# Patient Record
Sex: Male | Born: 1985 | Race: White | Hispanic: No | Marital: Single
Health system: Midwestern US, Community
[De-identification: ages and names within clinical notes are randomized; demographics above are authoritative.]

## PROBLEM LIST (undated history)

## (undated) HISTORY — PX: FACIAL FRACTURE SURGERY: SHX1570

---
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
## 2007-09-30 ENCOUNTER — Emergency Department: Payer: Self-pay | Admitting: Emergency Medicine
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
## 2011-03-25 ENCOUNTER — Emergency Department: Payer: Self-pay | Admitting: *Deleted

## 2014-04-15 ENCOUNTER — Encounter (HOSPITAL_COMMUNITY): Payer: Self-pay

## 2014-04-15 ENCOUNTER — Emergency Department (HOSPITAL_COMMUNITY)
Admission: EM | Admit: 2014-04-15 | Discharge: 2014-04-15 | Disposition: A | Discharge status: Home / Self Care | Payer: Self-pay | Attending: Emergency Medicine | Admitting: Emergency Medicine

## 2014-04-15 ENCOUNTER — Emergency Department (HOSPITAL_COMMUNITY): Payer: Self-pay

## 2014-04-15 DIAGNOSIS — W500XXA Accidental hit or strike by another person, initial encounter: Secondary | ICD-10-CM | POA: Insufficient documentation

## 2014-04-15 DIAGNOSIS — Z880 Allergy status to penicillin: Secondary | ICD-10-CM | POA: Insufficient documentation

## 2014-04-15 DIAGNOSIS — S0083XA Contusion of other part of head, initial encounter: Secondary | ICD-10-CM | POA: Insufficient documentation

## 2014-04-15 DIAGNOSIS — Y990 Civilian activity done for income or pay: Secondary | ICD-10-CM | POA: Insufficient documentation

## 2014-04-15 DIAGNOSIS — S0993XA Unspecified injury of face, initial encounter: Secondary | ICD-10-CM

## 2014-04-15 DIAGNOSIS — Y9389 Activity, other specified: Secondary | ICD-10-CM | POA: Insufficient documentation

## 2014-04-15 DIAGNOSIS — Z8781 Personal history of (healed) traumatic fracture: Secondary | ICD-10-CM | POA: Insufficient documentation

## 2014-04-15 DIAGNOSIS — Y9289 Other specified places as the place of occurrence of the external cause: Secondary | ICD-10-CM | POA: Insufficient documentation

## 2014-04-15 MED ORDER — ONDANSETRON HCL 4 MG PO TABS: 4.0000 mg | ORAL_TABLET | Freq: Four times a day (QID) | ORAL | Status: AC

## 2014-04-15 MED ORDER — CEPHALEXIN 500 MG PO CAPS: 500.0000 mg | ORAL_CAPSULE | Freq: Four times a day (QID) | ORAL | Status: AC

## 2014-04-15 MED ORDER — OXYCODONE-ACETAMINOPHEN 5-325 MG PO TABS: 1.0000 | ORAL_TABLET | Freq: Four times a day (QID) | ORAL | Status: AC | PRN

## 2014-04-15 MED ORDER — ONDANSETRON 4 MG PO TBDP
4.0000 mg | ORAL_TABLET | Freq: Once | ORAL | Status: AC
  Administered 2014-04-15: 4 mg via ORAL
  Filled 2014-04-15: qty 1

## 2014-04-15 MED ORDER — OXYCODONE-ACETAMINOPHEN 5-325 MG PO TABS
2.0000 | ORAL_TABLET | Freq: Once | ORAL | Status: AC
  Administered 2014-04-15: 2 via ORAL
  Filled 2014-04-15: qty 2

## 2014-04-15 MED ORDER — IBUPROFEN 800 MG PO TABS: 800.0000 mg | ORAL_TABLET | Freq: Three times a day (TID) | ORAL | Status: AC

## 2014-04-15 NOTE — Discharge Instructions (Signed)
Mandibular Contusion °A mandibular contusion is a deep bruise of your jaw. Contusions are the result of an injury that caused bleeding under the skin. The contusion may turn blue, purple, or yellow. Minor injuries will give you a painless contusion, but more severe contusions may stay painful and swollen for a few weeks.  °CAUSES °A mandibular contusion comes from a direct force to that area, such as falling or a punch to the jaw. °SYMPTOMS  °· Jaw pain. °· Jaw swelling. °· Jaw bruising. °· Jaw tenderness. °DIAGNOSIS  °The diagnosis can be made by taking your history and doing a physical exam. You may need an X-ray of your jaw to look for a broken bone (fracture). °TREATMENT °Often, the best treatment for a mandibular contusion is applying cold compresses to the injured area and eating a soft diet. Over-the-counter medicines may also be recommended for pain control.  °HOME CARE INSTRUCTIONS  °· Put ice on the injured area. °¨ Put ice in a plastic bag. °¨ Place a towel between your skin and the bag. °¨ Leave the ice on for 15-20 minutes, 03-04 times a day. °· Eat soft foods for 1 week. Soft foods include baby food, gelatin, cooked cereal, ice cream, applesauce, bananas, eggs, pasta, cottage cheese, soups, and yogurt. Cut food into smaller pieces for less chewing. Avoid chewing gum or ice. °· Only take over-the-counter or prescription medicines for pain, discomfort, or fever as directed by your caregiver. °· Avoid opening your mouth widely. This includes opening your mouth to eat large pieces of food or to yawn, scream, yell, or sing. °SEEK IMMEDIATE MEDICAL CARE IF:  °· Your swelling or pain is not relieved with medicines. °· You are not improving. °· You have any cracking or clicking (crepitation) in the jaw joint. °MAKE SURE YOU:  °· Understand these instructions. °· Will watch your condition. °· Will get help right away if you are not doing well or get worse. °Document Released: 07/24/2003 Document Revised:  07/26/2011 Document Reviewed: 03/19/2011 °Exit[employer]® Patient Information ©2015 [employer]. This information is not intended to replace advice given to you by your health care provider. Make sure you discuss any questions you have with your health care provider. ° °
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
## 2014-06-18 ENCOUNTER — Inpatient Hospital Stay
Admit: 2014-06-18 | Discharge: 2014-06-18 | Disposition: A | Discharge status: Home / Self Care | Attending: Emergency Medicine
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
## 2017-07-07 ENCOUNTER — Emergency Department
Admission: EM | Admit: 2017-07-07 | Discharge: 2017-07-07 | Discharge status: Against Medical Advice | Payer: Self-pay | Attending: Emergency Medicine | Admitting: Emergency Medicine

## 2017-07-07 ENCOUNTER — Encounter: Payer: Self-pay | Admitting: Emergency Medicine

## 2017-07-07 DIAGNOSIS — T401X1A Poisoning by heroin, accidental (unintentional), initial encounter: Secondary | ICD-10-CM | POA: Insufficient documentation

## 2017-07-07 MED ORDER — NALOXONE HCL 4 MG/0.1ML NA LIQD
1.0000 | Freq: Once | NASAL | Status: AC
  Administered 2017-07-07: 1 via NASAL
  Filled 2017-07-07 (×2): qty 4

## 2017-07-07 NOTE — ED Notes (Signed)
Attempts by this RN and charge RN to explain to patient procedure and reasons patient needs to stay. Pt states "I don't care I just want to leave."

## 2017-07-07 NOTE — ED Notes (Signed)
Pt provided with scrub top, scrub pants, and non slip socks. Pt refuses to use them or take them. Pt independently walking in room with steady gait.

## 2017-07-07 NOTE — ED Triage Notes (Addendum)
Pt arrived via ems after friend called ems due to patient being unresponsive. Pt given 1mg  intranasal narcan by fire dept. EMS arrived and administered 0.5mg  of IV narcan. Upon arrival to ED pt alert and oriented and adamant that he leave and take out IV. Pt denies any suicidal ideation. Pt states "I obviously took some bad stuff." Pt admits to injecting medication.

## 2017-07-07 NOTE — ED Provider Notes (Signed)
[facility] Emergency Department Provider Note   ____________________________________________    I have reviewed the triage vital signs and the nursing notes.   HISTORY  Chief Complaint Drug Overdose     HPI Miguel Richards. is a 32 y.o. male who presents after accidental drug overdose.  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  Apparently he became unresponsive call, EMS was called gave intranasal Narcan with little improvement, then IV Narcan with patient then returning to baseline.  Patient is quite clear that he was not trying to harm himself.  Immediately upon arrival the patient was asking to leave   History reviewed. No pertinent past medical history.  There are no active problems to display for this patient.   Past Surgical History:  Procedure Laterality Date  . FACIAL FRACTURE SURGERY      Prior to Admission medications   Medication Sig Start Date End Date Taking? Authorizing Provider  cephALEXin (KEFLEX) 500 MG capsule Take 1 capsule (500 mg total) by mouth 4 (four) times daily. 04/15/14   Marlon Pel, PA-C  ibuprofen (ADVIL,MOTRIN) 800 MG tablet Take 1 tablet (800 mg total) by mouth 3 (three) times daily. 04/15/14   Marlon Pel, PA-C  ondansetron (ZOFRAN) 4 MG tablet Take 1 tablet (4 mg total) by mouth every 6 (six) hours. 04/15/14   Marlon Pel, PA-C  oxyCODONE-acetaminophen (PERCOCET/ROXICET) 5-325 MG per tablet Take 1-2 tablets by mouth every 6 (six) hours as needed for severe pain. 04/15/14   Marlon Pel, PA-C     Allergies Penicillins  No family history on file.  Social History Social History   Tobacco Use  . Smoking status: Never Smoker  . Smokeless tobacco: Never Used  Substance Use Topics  . Alcohol use: Yes    Comment: social  . Drug use: No    Review of Systems  Constitutional: Denies fevers Eyes: No visual changes.  ENT: Denies neck  pain Cardiovascular: Denies chest pain. Respiratory: No cough Gastrointestinal:No nausea, no vomiting.   Genitourinary: Negative for dysuria. Musculoskeletal: Negative for back pain. Skin: Negative for rash. Neurological: Negative for headaches   ____________________________________________   PHYSICAL EXAM:  VITAL SIGNS: ED Triage Vitals [07/07/17 0833]  Enc Vitals Group     BP 126/85     Pulse Rate 97     Resp 14     Temp 97.6 F (36.4 C)     Temp Source Oral     SpO2 100 %     Weight      Height      Head Circumference      Peak Flow      Pain Score      Pain Loc      Pain Edu?      Excl. in GC?     Constitutional: Alert and oriented. No acute distress.  Eyes: Conjunctivae are normal.   Nose: No congestion/rhinnorhea.  Neck:  Painless ROM Cardiovascular: Normal rate, regular rhythm.  Good peripheral circulation. Respiratory: Normal respiratory effort.  No retractions. Lungs CTAB. Gastrointestinal: Soft and nontender. No distention.   Musculoskeletal:   Warm and well perfused Neurologic:  Normal speech and language. No gross focal neurologic deficits are appreciated.  Skin:  Skin is warm, dry and intact. No rash noted. Psychiatric: Mood and affect are normal. Speech and behavior are normal.  ____________________________________________   LABS (all labs ordered are listed, but only abnormal results are displayed)  Labs Reviewed  COMPREHENSIVE METABOLIC PANEL  ETHANOL  CBC  URINE DRUG SCREEN, QUALITATIVE ([facility] ONLY)   ____________________________________________  EKG  ED ECG REPORT I, Jene Everyobert Payton Prinsen, the attending physician, personally viewed and interpreted this ECG.  Date: 07/07/2017  Rhythm: normal sinus rhythm QRS Axis: normal Intervals: normal ST/T Wave abnormalities: normal Narrative Interpretation: no evidence of acute  ischemia  ____________________________________________  RADIOLOGY  None ____________________________________________   PROCEDURES  Procedure(s) performed: No  Procedures   Critical Care performed: No ____________________________________________   INITIAL IMPRESSION / ASSESSMENT AND PLAN / ED COURSE  Pertinent labs & imaging results that were available during my care of the patient were reviewed by me and considered in my medical decision making (see chart for details).  Patient presents after accidental drug overdose.  He is alert and oriented x4 with no evidence of any altered mental status.  He certainly has decisional capacity and is adamant that he wants to leave.  I discussed with him that Narcan can wear off at which point he may become unresponsive again and he accepts that risk states that "I am a grown man and I feel fine and I am okay with that "  Asked him to stay for just 15 minutes of observation but he refused.  Provided Narcan inhaler for outpatient/emergent use if necessary  Patient signed out AGAINST MEDICAL ADVICE   ____________________________________________   FINAL CLINICAL IMPRESSION(S) / ED DIAGNOSES  Final diagnoses:  Accidental overdose of heroin, initial encounter [facility](HCC)        Note:  This document was prepared using Dragon voice recognition software and may include unintentional dictation errors.    Jene EveryKinner, Kasidee Voisin, MD 07/07/17 [phone]

## 2017-07-07 NOTE — ED Notes (Signed)
MD Kinner at bedside explaining risk for leaving. MD willing to work with patients time frame. Pt unwilling to cooperate. IV removed and pt willing to sign AMA. Charge RN made aware.

## 2019-01-06 ENCOUNTER — Emergency Department
Admission: EM | Admit: 2019-01-06 | Discharge: 2019-01-06 | Disposition: A | Discharge status: Home / Self Care | Payer: Self-pay | Attending: Emergency Medicine | Admitting: Emergency Medicine

## 2019-01-06 ENCOUNTER — Other Ambulatory Visit: Payer: Self-pay

## 2019-01-06 DIAGNOSIS — M791 Myalgia, unspecified site: Secondary | ICD-10-CM | POA: Insufficient documentation

## 2019-01-06 DIAGNOSIS — T40601A Poisoning by unspecified narcotics, accidental (unintentional), initial encounter: Secondary | ICD-10-CM | POA: Insufficient documentation

## 2019-01-06 DIAGNOSIS — F17210 Nicotine dependence, cigarettes, uncomplicated: Secondary | ICD-10-CM | POA: Insufficient documentation

## 2019-01-06 LAB — GLUCOSE, CAPILLARY: Glucose-Capillary: 79 mg/dL (ref 70–99)

## 2019-01-06 MED ORDER — NALOXONE HCL 4 MG/0.1ML NA LIQD
1.0000 | Freq: Once | NASAL | Status: AC
Start: 2019-01-06 — End: 2019-01-06
  Administered 2019-01-06: 19:00:00 1 via NASAL
  Filled 2019-01-06: qty 4
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
## 2019-01-06 NOTE — Discharge Instructions (Signed)

## 2019-01-06 NOTE — BH Assessment (Signed)
Tele Assessment Note   Patient Name: Miguel MallardBrian A Toole Jr. MRN: [id number] Referring Physician: EDP [id number]  Miguel NettlesBrian A Villacres Richards HagemanJr. is an 33 y.o. male who presented to the ED with EMS for overdose.   TTS completed assessment via telemedicine. Patient was alert, oriented, and engaged. He stated he made a "stupid decision" and "it won't happen again." Patient describes having been offered a white substance for pain after completing some work on a house today.  Patient was evasive about his history of drug use but reports some alcohol use which has reduced over time, periodic cannabis use, distant history of opioid use and experimental use of cocaine.  Patient denied SI, HI, or psychosis. He denies feeling depressed, but is sad about the recent death of his uncle in the last week. He stated "I want to live; I want to be there for my daughter." He is excited about seeing his daughter today, and stated "I don't want her to see me like this" and wanted to ensure TTS that he is a "good person."  TTS encouraged the patient to practice honesty about his substance use and offered resources if the patient would like to pursue treatment. Patient denied a history of AA/NA or other fellowship meetings. He denied prior inpatient or outpatient treatment. 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 He identifies what he does for run: playing disc golf, guitar, riding four wheelers, and is able to describe future plans. He reports a good appetite and minimal sleep disturbance. He identified family support as his Aunt and grandmother.  He states a plan not to interact with the people he was with today again in the future.   Diagnosis: Overdose  Past Medical History: History reviewed. No pertinent past medical history.  Past Surgical History:  Procedure Laterality Date  . FACIAL FRACTURE  SURGERY      Family History: History reviewed. No pertinent family history.  Social History:  reports that he has been smoking cigarettes. He has a 5.00 pack-year smoking history. He has never used smokeless tobacco. He reports current alcohol use. He reports that he does not use drugs.  Additional Social History:  Alcohol / Drug Use Pain Medications: see PTA Prescriptions: see PTA Over the Counter: see PTA History of alcohol / drug use?: Yes Substance #1 Name of Substance 1: cannabis 1 - Last Use / Amount: about a year Substance #2 Name of Substance 2: alcohol 2 - Last Use / Amount: a drink Substance #3 Name of Substance 3: cocaine 3 - Frequency: experimental Substance #4 Name of Substance 4: opioids 4 - Last Use / Amount: experimental; today  CIWA: CIWA-Ar BP: 137/82 Pulse Rate: 89 COWS: Clinical Opiate Withdrawal Scale (COWS) Resting Pulse Rate: Pulse Rate 101-120 Sweating: No report of chills or flushing Restlessness: Able to sit still Pupil Size: Pupils possibly larger than normal for room light Bone or Joint Aches: Not present Runny Nose or Tearing: Not present GI Upset: No GI symptoms Tremor: No tremor Yawning: No yawning Anxiety or Irritability: None Gooseflesh Skin: Skin is smooth COWS Total Score: 3  Allergies:  Allergies  Allergen Reactions  . Penicillins Other (See Comments)    unknown    Home Medications: (Not in a hospital admission)   OB/GYN Status:  No LMP for male patient.  General Assessment Data [facility] TTS Assessment: In system Is  this a Tele or Face-to-Face Assessment?: Tele Assessment Is this an Initial Assessment or a Re-assessment for this encounter?: Initial Assessment Patient Accompanied by:: N/A Language Other than English: No Living Arrangements: Other (Comment) What gender do you identify as?: Male Marital status: Single Pregnancy Status: No Living Arrangements: Other (Comment)(wtih roomates) Can pt  return to current living arrangement?: Yes Admission Status: Voluntary Is patient capable of signing voluntary admission?: Yes Referral Source: Self/Family/Friend Insurance type: [place] Medicaid  Medical Screening Exam [facility] Walk-in ONLY) Medical Exam completed: Yes  Crisis Care Plan Living Arrangements: Other (Comment)(wtih roomates) Legal Guardian: (self) Name of Psychiatrist: none Name of Therapist: none  Education Status Is patient currently in school?: No Is the patient employed, unemployed or receiving disability?: Employed  Risk to self with the past 6 months Suicidal Ideation: No Has patient been a risk to self within the past 6 months prior to admission? : No Suicidal Intent: No Has patient had any suicidal intent within the past 6 months prior to admission? : No Is patient at risk for suicide?: No Suicidal Plan?: No Has patient had any suicidal plan within the past 6 months prior to admission? : No Access to Means: No What has been your use of drugs/alcohol within the last 12 months?: Experimental use today; alcohol use in last week Previous Attempts/Gestures: No How many times?: 0 Other Self Harm Risks: none noted Triggers for Past Attempts: None known Intentional Self Injurious Behavior: None Family Suicide History: No Recent stressful life event(s): Loss (Comment)(relationship loss in the past year) Persecutory voices/beliefs?: No Depression: No Substance abuse history and/or treatment for substance abuse?: Yes Suicide prevention information given to non-admitted patients: Yes  Risk to Others within the past 6 months Homicidal Ideation: No Does patient have any lifetime risk of violence toward others beyond the six months prior to admission? : No Thoughts of Harm to Others: No Current Homicidal Intent: No Current Homicidal Plan: No Access to Homicidal Means: No History of harm to others?: No Assessment of Violence: None Noted Violent Behavior Description:  none Does patient have access to weapons?: No Criminal Charges Pending?: No Does patient have a court date: No Is patient on probation?: No  Psychosis Hallucinations: None noted Delusions: None noted  Mental Status Report Appearance/Hygiene: Unremarkable Eye Contact: Good Motor Activity: Freedom of movement Speech: Logical/coherent Level of Consciousness: Alert Mood: Ashamed/humiliated Affect: Appropriate to circumstance Anxiety Level: None Thought Processes: Coherent, Relevant Judgement: Partial Orientation: Person, Place, Time, Situation, Appropriate for developmental age Obsessive Compulsive Thoughts/Behaviors: None  Cognitive Functioning Concentration: Normal Memory: Recent Intact, Remote Intact Is patient IDD: No Insight: Fair Impulse Control: Poor Appetite: Good Have you had any weight changes? : No Change Sleep: No Change Vegetative Symptoms: None  ADLScreening [facility] Assessment Services) Patient's cognitive ability adequate to safely complete daily activities?: Yes Patient able to express need for assistance with ADLs?: Yes Independently performs ADLs?: Yes (appropriate for developmental age)  Prior Inpatient Therapy Prior Inpatient Therapy: No  Prior Outpatient Therapy Prior Outpatient Therapy: No Does patient have an ACCT team?: No Does patient have Intensive In-House Services?  : No Does patient have [facility] services? : No Does patient have P4CC services?: No  ADL Screening (condition at time of admission) Patient's cognitive ability adequate to safely complete daily activities?: Yes Is the patient deaf or have difficulty hearing?: No Does the patient have difficulty seeing, even when wearing glasses/contacts?: No Does the patient have difficulty concentrating, remembering, or making decisions?: No Patient able to express need  for assistance with ADLs?: Yes Does the patient have difficulty dressing or bathing?: No Independently performs ADLs?: Yes  (appropriate for developmental age) Does the patient have difficulty walking or climbing stairs?: No Weakness of Legs: None Weakness of Arms/Hands: None  Home Assistive Devices/Equipment Home Assistive Devices/Equipment: None  Therapy Consults (therapy consults require a physician order) PT Evaluation Needed: No OT Evalulation Needed: No SLP Evaluation Needed: No   Values / Beliefs Cultural Requests During Hospitalization: None Spiritual Requests During Hospitalization: None Consults Spiritual Care Consult Needed: No Social Work Consult Needed: No Regulatory affairs officer (For Healthcare) Does Patient Have a Medical Advance Directive?: No Would patient like information on creating a medical advance directive?: No - Patient declined          Disposition:  Disposition Initial Assessment Completed for this Encounter: Yes  This service was provided via telemedicine using a 2-way, interactive audio and video technology.  Names of all persons participating in this telemedicine service and their role in this encounter. TTS and patient were present for the duration of the assessment. Telemedicine was set up by the patient's nurse.   [place] 01/06/2019 6:57 PM

## 2019-01-06 NOTE — ED Triage Notes (Signed)
Pt states he injected an unknown white substance and passed out at friends house. Pt & O x 4 upon arrival.

## 2019-01-06 NOTE — BH Assessment (Signed)
Resources for patient:   [place] [phone] The Lifeline provides 24/7; free and confidential support for people in distress, prevention and crisis resources for you or your loved ones, and best practices for professionals  [facility] [phone] This is a 24/7 phone number for crisis evaluation.   [facility] [phone] This is a 24/7 phone number for crisis evaluation.   [facility] Mobile Crisis Line [phone] This is a 24/7 phone number for crisis evaluation.   [place]  [place], [place] [address] [phone] Monday-Friday 8:00AM to 8:00PM   Substance Use [place]  [email or web address] [address].  [place], [place]  [facility] offers detoxification services and a short-term residential treatment program.     [place], [place] [address] [phone]  [facility] [place] offers non-hospital detoxification, facility-based crisis services, residential services to people with substance use disorders, and residential services to people with mental illness.      Substance Use [place] [email or web address] [place].  [place], [place] [address] [facility] is a 77-bed hospital for people with mental illness and substance use disorders.  They offer inpatient and outpatient mental health treatment as well as detoxification and substance use disorders treatment.  They accept [employer], [place], [employer] Medicaid, and commercial insurance plans. The assessment department at [facility] is available 24/7 and walk-ins  are welcome.    [place]  [place].  [place], [place] [address] [phone]  [facility] offers residential treatment services for treatment of substance use disorders who live in [place]  and have no insurance or [place].    [facility] [email or web address]  [place], [place] [address] [phone]  [facility] offers residential and outpatient treatment programs for substance use disorders.  Payment methods include private insurance and self-pay.   [place] [email or web address] [place], [place] [address] [phone]  [facility] offers outpatient therapy, psychiatric care, intensive in home services (IIH), peer support services, Substance Abuse Intensive Outpatient Services (SAIOP) and Conservation officer, nature (CST) as well as intellectual developmental disability specialty services.   [employer] [facility], [place], [place] [address] [place] offers comprehensive clinical assessment, psychological evaluation, medication management, substance abuse intensive outpatient (SAIOP), intensive in-home therapy (IIH), and community support team (CST), and suboxone maintenance therapy.  Payment methods at [employer] include [facility] and other methods.  Please call to verify.   [facility] [place] [facility] Ab[facility]) [address] [place], [place] [address] [phone]  [facility] is an innovative, multi-year residential program that empowers people with substance use disorders to be productive, recovering individuals by providing comprehensive treatment, experiential vocational training, education, and continuing care.

## 2019-01-06 NOTE — BH Assessment (Signed)
TTS is available to complete assessment via tele. Please call [phone].

## 2019-01-06 NOTE — ED Provider Notes (Signed)
[facility] Emergency Department Provider Note   ____________________________________________   First MD Initiated Contact with Patient 01/06/19 1623     (approximate)  I have reviewed the triage vital signs and the nursing notes.   HISTORY  Chief Complaint Drug Overdose    HPI Miguel Ohair. is a 33 y.o. male reports that he overdosed  Patient tells me that he was out, he got walking was hot.  He felt a little achy from being outside and someone offered him pain medicine.  He allowed them to inject a white substance into his right arm.  He then does not have recollection of then waking up with paramedics.  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  Thinks what he was using was heroin but is not really sure  No fevers or chills.  No nausea vomiting.  Reports he feels well.  He has no complaint right now.  Reports that was "a dumb idea" and that "I will never do it again"  Denies suicidal ideation.  Denies homicidal ideation.  Does not want to kill himself.   History reviewed. No pertinent past medical history.  There are no active problems to display for this patient.   Past Surgical History:  Procedure Laterality Date  . FACIAL FRACTURE SURGERY      Prior to Admission medications   Medication Sig Start Date End Date Taking? Authorizing Provider                              Takes no medications now  Allergies Penicillins  History reviewed. No pertinent family history.  Social History Social History   Tobacco Use  . Smoking status: Current Every Day Smoker    Packs/day: 0.50    Years: 10.00    Pack years: 5.00    Types: Cigarettes  . Smokeless tobacco: Never Used  Substance Use Topics  . Alcohol use: Yes    Comment: social  . Drug use: No    Review of Systems Constitutional: No fever/chills he has not been around anyone known to have coronavirus Eyes: No visual changes. ENT: No sore throat. Cardiovascular: Denies  chest pain. Respiratory: Denies shortness of breath. Gastrointestinal: No abdominal pain.   Genitourinary: Negative for dysuria. Musculoskeletal: Negative for back pain. Skin: Negative for rash. Neurological: Negative for headaches, areas of focal weakness or numbness.    ____________________________________________   PHYSICAL EXAM:  VITAL SIGNS: ED Triage Vitals  Enc Vitals Group     BP 01/06/19 1621 (!) 128/100     Pulse Rate 01/06/19 1621 (!) 110     Resp 01/06/19 1621 19     Temp 01/06/19 1621 98.4 F (36.9 C)     Temp Source 01/06/19 1621 Oral     SpO2 01/06/19 1621 100 %     Weight 01/06/19 1622 155 lb (70.3 kg)     Height 01/06/19 1622 5\' 7"  (1.702 m)     Head Circumference --      Peak Flow --      Pain Score 01/06/19 1622 0     Pain Loc --      Pain Edu? --      Excl. in [place]? --     Constitutional: Alert and oriented. Well appearing and in no acute distress.  He is pleasant.  Compliant. Eyes: Conjunctivae are normal. Head: Atraumatic. Nose: No congestion/rhinnorhea. Mouth/Throat: Mucous membranes are moist. Neck: No  stridor.  Cardiovascular: Just slightly tachycardic rate, regular rhythm. Grossly normal heart sounds.  Good peripheral circulation. Respiratory: Normal respiratory effort.  No retractions. Lungs CTAB. Gastrointestinal: Soft and nontender. No distention. Musculoskeletal: No lower extremity tenderness nor edema. Neurologic:  Normal speech and language. No gross focal neurologic deficits are appreciated.  Skin:  Skin is warm, dry and intact. No rash noted. Psychiatric: Mood and affect are normal. Speech and behavior are normal.  Denies any desire to harm himself.  Reports an unintended overdose on what he thought was probably heroin  ____________________________________________   LABS (all labs ordered are listed, but only abnormal results are displayed)  Labs Reviewed  GLUCOSE, CAPILLARY  CBG MONITORING, ED    ____________________________________________  EKG  Reviewed entered by me at 1622 Heart rate 105 QRS 90 QTc 430 Sinus tachycardia ____________________________________________  RADIOLOGY   ____________________________________________   PROCEDURES  Procedure(s) performed: None  Procedures  Critical Care performed: No  ____________________________________________   INITIAL IMPRESSION / ASSESSMENT AND PLAN / ED COURSE  Pertinent labs & imaging results that were available during my care of the patient were reviewed by me and considered in my medical decision making (see chart for details).   Patient initially unresponsive, apneic.  Administered Narcan by EMS.  Now fully awake and alert.  Reports he thought he was using heroin.  Expresses remorse, denies desire to harm himself or others.  Does not meet criteria for IVC.  Fully awake and alert pleasant at this time.  Does not appear to have significant symptoms of opioid withdrawal, he is not sweaty, he is not shaky, just minimally tachycardic.  Reports being opioid nave prior to this except for a distant history of heroin abuse  Denies taking any oral medications.  Will observe patient for approximately 2 hours for signs of recurring overdose, otherwise suspect patient will be medically clear at that time.  Patient is agreeable with this plan.    ----------------------------------------- 6:46 PM on 01/06/2019 -----------------------------------------  Patient resting comfortably.  Normal vital signs.  Reports he feels perfectly fine.  He is remorseful about overdose, he has a lot to live for reports that his daughter is visiting from town tonight.  He denies any desire to harm himself or anyone else.  I discussed with the patient will dispense him with a rescue intranasal naloxone though he tells me he is never going to use again.  Resources and recommendation to follow-up with RTS provided  Return precautions and treatment  recommendations and follow-up discussed with the patient who is agreeable with the plan.  Miguel MallardBrian A Reaney Jr. was evaluated in Emergency Department on 01/06/2019 for the symptoms described in the history of present illness. He was evaluated in the context of the global COVID-19 pandemic, which necessitated consideration that the patient might be at risk for infection with the SARS-CoV-2 virus that causes COVID-19. Institutional protocols and algorithms that pertain to the evaluation of patients at risk for COVID-19 are in a state of rapid change based on information released by regulatory bodies including the CDC and federal and state organizations. These policies and algorithms were followed during the patient's care in the ED.  ____________________________________________   FINAL CLINICAL IMPRESSION(S) / ED DIAGNOSES  Final diagnoses:  Opiate overdose, accidental or unintentional, initial encounter [address] [facility](HCC)        Note:  This document was prepared using Dragon voice recognition software and may include unintentional dictation errors       Sharyn CreamerQuale, Reita Shindler, MD 01/06/19 1847

## 2020-11-21 ENCOUNTER — Other Ambulatory Visit: Payer: Self-pay

## 2020-11-21 ENCOUNTER — Emergency Department
Admission: EM | Admit: 2020-11-21 | Discharge: 2020-11-21 | Disposition: A | Discharge status: Home / Self Care | Payer: Self-pay | Attending: Student in an Organized Health Care Education/Training Program | Admitting: Student in an Organized Health Care Education/Training Program

## 2020-11-21 DIAGNOSIS — T402X1A Poisoning by other opioids, accidental (unintentional), initial encounter: Secondary | ICD-10-CM | POA: Insufficient documentation

## 2020-11-21 DIAGNOSIS — R000 Tachycardia, unspecified: Secondary | ICD-10-CM | POA: Insufficient documentation

## 2020-11-21 DIAGNOSIS — F17210 Nicotine dependence, cigarettes, uncomplicated: Secondary | ICD-10-CM | POA: Insufficient documentation

## 2020-11-21 DIAGNOSIS — T40601A Poisoning by unspecified narcotics, accidental (unintentional), initial encounter: Secondary | ICD-10-CM

## 2020-11-21 MED ORDER — NALOXONE HCL 4 MG/0.1ML NA LIQD: NASAL | 1 refills | Status: AC

## 2020-11-21 MED ORDER — SODIUM CHLORIDE 0.9 % IV BOLUS
1000.0000 mL | Freq: Once | INTRAVENOUS | Status: AC
  Administered 2020-11-21: 1000 mL via INTRAVENOUS

## 2020-11-21 NOTE — ED Notes (Signed)
PD at bedside.

## 2020-11-21 NOTE — ED Provider Notes (Signed)
[facility] Emergency Department Provider Note    Event Date/Time   First MD Initiated Contact with Patient 11/21/20 1228     (approximate)  I have reviewed the triage vital signs and the nursing notes.   HISTORY  Chief Complaint Drug Overdose    HPI Miguel Fruth. is a 35 y.o. male presents to the ER via EMS after overdose.  States he was feeling sore was with a friend who gave him Percocet which she took.  Thinks it was a much higher dose or some other medication that he typically takes.  States he started feeling very funny.  Reported did slam his head against the [place] and then went unresponsive had CPR performed.  EMS found patient given a dose of Narcan with improvement.  Patient denies any pain.  No headache.  No numbness or tingling.  Denies any chest pain or shortness of breath.  States that this was purely recreational was not having any thoughts of harming himself.  No past medical history on file. No family history on file. Past Surgical History:  Procedure Laterality Date   FACIAL FRACTURE SURGERY     There are no problems to display for this patient.     Prior to Admission medications   Medication Sig Start Date End Date Taking? Authorizing Provider  cephALEXin (KEFLEX) 500 MG capsule Take 1 capsule (500 mg total) by mouth 4 (four) times daily. 04/15/14   Marlon Pel, PA-C  ibuprofen (ADVIL,MOTRIN) 800 MG tablet Take 1 tablet (800 mg total) by mouth 3 (three) times daily. 04/15/14   Marlon Pel, PA-C  ondansetron (ZOFRAN) 4 MG tablet Take 1 tablet (4 mg total) by mouth every 6 (six) hours. 04/15/14   Marlon Pel, PA-C  oxyCODONE-acetaminophen (PERCOCET/ROXICET) 5-325 MG per tablet Take 1-2 tablets by mouth every 6 (six) hours as needed for severe pain. 04/15/14   Marlon Pel, PA-C    Allergies Penicillins    Social History Social History   Tobacco Use   Smoking status: Every Day    Packs/day: 0.50    Years:  10.00    Pack years: 5.00    Types: Cigarettes   Smokeless tobacco: Never  Substance Use Topics   Alcohol use: Yes    Comment: social   Drug use: No    Review of Systems Patient denies headaches, rhinorrhea, blurry vision, numbness, shortness of breath, chest pain, edema, cough, abdominal pain, nausea, vomiting, diarrhea, dysuria, fevers, rashes or hallucinations unless otherwise stated above in HPI. ____________________________________________   PHYSICAL EXAM:  VITAL SIGNS: Vitals:   11/21/20 1218  BP: 129/84  Resp: 16  Temp: 98.3 F (36.8 C)  SpO2: 98%    Constitutional: Alert and oriented.  Eyes: Conjunctivae are normal.  Head: contusion to forehead, no bleeding or laceration.  No battles sign, or racoon eyes Nose: No congestion/rhinnorhea. Mouth/Throat: Mucous membranes are moist.   Neck: No stridor. Painless ROM.  Cardiovascular: mildlly tachycardic rate, regular rhythm. Grossly normal heart sounds.  Good peripheral circulation. Respiratory: Normal respiratory effort.  No retractions. Lungs CTAB. Gastrointestinal: Soft and nontender. No distention. No abdominal bruits. No CVA tenderness. Genitourinary: deferred Musculoskeletal: No lower extremity tenderness nor edema.  No joint effusions. Neurologic:  CN- intact.  No facial droop, Normal FNF.  Normal heel to shin.  Sensation intact bilaterally. Normal speech and language. No gross focal neurologic deficits are appreciated. No gait instability.  Skin:  Skin is warm, dry and intact. No rash noted. Psychiatric: Mood  and affect are normal. Speech and behavior are normal.  ____________________________________________   LABS (all labs ordered are listed, but only abnormal results are displayed)  No results found for this or any previous visit (from the past 24 hour(s)). ____________________________________________  EKG My review and personal interpretation at Time: 12:20 Indication: 120  Rate: 120  Rhythm: sinuis  Axis: normal Other: normal intervals, poor r wave progressions, unchanged as compared to previous tracing ____________________________________________  RADIOLOGY  I personally reviewed all radiographic images ordered to evaluate for the above acute complaints and reviewed radiology reports and findings.  These findings were personally discussed with the patient.  Please see medical record for radiology report.  ____________________________________________   PROCEDURES  Procedure(s) performed:  Procedures    Critical Care performed: no ____________________________________________   INITIAL IMPRESSION / ASSESSMENT AND PLAN / ED COURSE  Pertinent labs & imaging results that were available during my care of the patient were reviewed by me and considered in my medical decision making (see chart for details).   DDX: Overdose, dysrhythmia electrolyte abnormality, SDH, IPH, SAH, fracture  Miguel Richards. is a 35 y.o. who presents to the ED with presentation as described above.  Currently nontoxic-appearing mildly tachycardic denies any chest pain or shortness of breath.  Given head injury I did recommend CT imaging to evaluate for the but differential.  Patient is declining imaging at this time states he feels well.  Demonstrates understanding that given mechanism of injury could have sustained intracranial trauma and understands that failure to diagnosis could result in death or permanent disability.  He wants to be observed instead.  Will give hydration will observe.  Does not meet criteria for IVC.  He is mentating appropriately no focal deficits.  The patient will be placed on continuous pulse oximetry and telemetry for monitoring.  Laboratory evaluation will be sent to evaluate for the above complaints.     Clinical Course as of 11/21/20 1429  Fri Nov 21, 2020  1424 Patient reassessed.  Mentating well.  Denies any pain or discomfort.  Appears well perfused.  No acute distress.   Borderline tachycardic but otherwise well-appearing and as he is asymptomatic at this time he is requesting discharge.  No SI or HI does not meet criteria for IVC.  Will be given referral for substance abuse counseling.   [PR]    Clinical Course User Index [PR] Willy Eddy, MD    The patient was evaluated in Emergency Department today for the symptoms described in the history of present illness. He/she was evaluated in the context of the global COVID-19 pandemic, which necessitated consideration that the patient might be at risk for infection with the SARS-CoV-2 virus that causes COVID-19. Institutional protocols and algorithms that pertain to the evaluation of patients at risk for COVID-19 are in a state of rapid change based on information released by regulatory bodies including the CDC and federal and state organizations. These policies and algorithms were followed during the patient's care in the ED.  As part of my medical decision making, I reviewed the following data within the electronic MEDICAL RECORD NUMBER Nursing notes reviewed and incorporated, Labs reviewed, notes from prior ED visits and [place] Controlled Substance Database   ____________________________________________   FINAL CLINICAL IMPRESSION(S) / ED DIAGNOSES  Final diagnoses:  Opiate overdose, accidental or unintentional, initial encounter (HCC)      NEW MEDICATIONS STARTED DURING THIS VISIT:  New Prescriptions   No medications on file     Note:  This document was prepared using Dragon voice recognition software and may include unintentional dictation errors.    Willy Eddy, MD 11/21/20 [phone]

## 2020-11-21 NOTE — ED Triage Notes (Addendum)
BIB EMS from [facility]. Banging his head on the [place]. Staff called 911. He went outside passed out witnessed by bystander. PD initiated CPR and admin narcan and got pulses back. He snorted a pill of unknown substance. Pt thought he was doing a percocet per the pt. Pt denies any SI.   142/90 125 100% on RA

## 2021-02-14 DEATH — deceased
